# Patient Record
Sex: Female | Born: 2000
Health system: Southern US, Community
[De-identification: ages and names within clinical notes are randomized; demographics above are authoritative.]

## PROBLEM LIST (undated history)

## (undated) ENCOUNTER — Emergency Department: Admission: EM | Payer: Self-pay | Source: Home / Self Care

## (undated) DIAGNOSIS — Q249 Congenital malformation of heart, unspecified: Secondary | ICD-10-CM

## (undated) DIAGNOSIS — R519 Headache, unspecified: Secondary | ICD-10-CM

## (undated) DIAGNOSIS — R079 Chest pain, unspecified: Secondary | ICD-10-CM

## (undated) HISTORY — DX: Chest pain, unspecified: R07.9

## (undated) HISTORY — DX: Headache, unspecified: R51.9

## (undated) HISTORY — DX: Congenital malformation of heart, unspecified: Q24.9

## (undated) HISTORY — PX: WRIST FRACTURE SURGERY: SHX121

---
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
## 2019-06-10 ENCOUNTER — Other Ambulatory Visit: Payer: Self-pay

## 2019-06-10 DIAGNOSIS — Z20822 Contact with and (suspected) exposure to covid-19: Secondary | ICD-10-CM

## 2019-06-11 LAB — NOVEL CORONAVIRUS, NAA: SARS-CoV-2, NAA: NOT DETECTED

## 2019-11-26 ENCOUNTER — Ambulatory Visit: Payer: 59 | Attending: Internal Medicine

## 2019-11-26 DIAGNOSIS — Z20822 Contact with and (suspected) exposure to covid-19: Secondary | ICD-10-CM | POA: Insufficient documentation

## 2019-11-27 LAB — NOVEL CORONAVIRUS, NAA: SARS-CoV-2, NAA: NOT DETECTED

## 2019-11-27 LAB — SARS-COV-2, NAA 2 DAY TAT

## 2020-01-01 ENCOUNTER — Ambulatory Visit (HOSPITAL_COMMUNITY)
Admission: EM | Admit: 2020-01-01 | Discharge: 2020-01-01 | Disposition: A | Discharge status: Home / Self Care | Payer: 59 | Attending: Physician Assistant | Admitting: Physician Assistant

## 2020-01-01 ENCOUNTER — Encounter (HOSPITAL_COMMUNITY): Payer: Self-pay

## 2020-01-01 ENCOUNTER — Telehealth: Payer: Self-pay | Admitting: *Deleted

## 2020-01-01 ENCOUNTER — Other Ambulatory Visit: Payer: Self-pay

## 2020-01-01 DIAGNOSIS — J3089 Other allergic rhinitis: Secondary | ICD-10-CM | POA: Insufficient documentation

## 2020-01-01 DIAGNOSIS — J069 Acute upper respiratory infection, unspecified: Secondary | ICD-10-CM | POA: Insufficient documentation

## 2020-01-01 DIAGNOSIS — Z20822 Contact with and (suspected) exposure to covid-19: Secondary | ICD-10-CM | POA: Insufficient documentation

## 2020-01-01 MED ORDER — MUCINEX 600 MG PO TB12
600.0000 mg | ORAL_TABLET | Freq: Two times a day (BID) | ORAL | 0 refills | Status: AC
Start: 2020-01-01 — End: 2020-01-08

## 2020-01-01 MED ORDER — FLUTICASONE PROPIONATE 50 MCG/ACT NA SUSP: 1.0000 | Freq: Every day | NASAL | 1 refills | Status: DC

## 2020-01-01 MED ORDER — CETIRIZINE HCL 10 MG PO TABS: 10.0000 mg | ORAL_TABLET | Freq: Every day | ORAL | 0 refills | Status: DC

## 2020-01-01 MED ORDER — SALINE SPRAY 0.65 % NA SOLN: 1.0000 | NASAL | 0 refills | Status: DC | PRN

## 2020-01-01 MED ORDER — BENZONATATE 100 MG PO CAPS: 100.0000 mg | ORAL_CAPSULE | Freq: Three times a day (TID) | ORAL | 0 refills | Status: DC

## 2020-01-01 MED ORDER — CETIRIZINE HCL 10 MG PO TABS
10.0000 mg | ORAL_TABLET | Freq: Every day | ORAL | 0 refills | Status: DC
Start: 2020-01-01 — End: 2023-06-09

## 2020-01-01 MED ORDER — BENZONATATE 100 MG PO CAPS
100.0000 mg | ORAL_CAPSULE | Freq: Three times a day (TID) | ORAL | 0 refills | Status: DC
Start: 2020-01-01 — End: 2023-06-09

## 2020-01-01 MED ORDER — SALINE SPRAY 0.65 % NA SOLN
1.0000 | NASAL | 0 refills | Status: DC | PRN
Start: 2020-01-01 — End: 2023-06-09

## 2020-01-01 MED ORDER — MUCINEX 600 MG PO TB12
600.0000 mg | ORAL_TABLET | Freq: Two times a day (BID) | ORAL | 0 refills | Status: DC
Start: 2020-01-01 — End: 2020-01-01

## 2020-01-01 MED FILL — BENZONATATE 100 MG CAPS: 100 | 7 days supply | Qty: 21 | Fill #0

## 2020-01-01 NOTE — Discharge Instructions (Addendum)
Use the Tessalon for cough as needed Take Zyrtec daily Use the Flonase daily Take the Mucinex twice a day for 1 week, and sure you are drinking plenty of fluids and clearing your nasal secretions Use the nasal saline throughout the day  If you are worsening over the next 3 to 4 days or not had improvement in 1 week please return for reevaluation or schedule video visit or follow-up with primary care, if you do not have a primary care of supplied an option for you.  If your Covid-19 test is positive, you will receive a phone call from [facility] regarding your results. Negative test results are not called. Both positive and negative results area always visible on MyChart. If you do not have a MyChart account, sign up instructions are in your discharge papers.   Persons who are directed to care for themselves at home may discontinue isolation under the following conditions:   At least 10 days have passed since symptom onset and  At least 24 hours have passed without running a fever (this means without the use of fever-reducing medications) and  Other symptoms have improved.  Persons infected with COVID-19 who never develop symptoms may discontinue isolation and other precautions 10 days after the date of their first positive COVID-19 test.

## 2020-01-01 NOTE — ED Triage Notes (Signed)
Pt reports cough worse at night and nasal congestion x 1 week.

## 2020-01-01 NOTE — Telephone Encounter (Signed)
Pt called and requested her R xbe sent to a different pharmacy.  RNCM contacted prescribing EDP to assist.

## 2020-01-01 NOTE — ED Provider Notes (Signed)
[facility]    CSN: [id number] Arrival date & time: 01/01/20  4854      History   Chief Complaint Chief Complaint  Patient presents with  . Cough  . Nasal Congestion    HPI Erin Todd is a 19 y.o. female.   Patient presents for nasal congestion, runny nose and nighttime cough.  She reports symptoms started about 7 to 8 days ago with nasal congestion and runny nose.  She reports she developed a cough that is primarily worse at night.  She reports feeling drainage in her throat.  Denies seeing a sore throat.  Cough is occasionally slightly productive in the morning.  Denies shortness of breath or chest pain.  Denies headache or facial pain.  Denies ear pain.  Denies nausea, vomiting, diarrhea.  Denies change in taste or smell.     History reviewed. No pertinent past medical history.  There are no problems to display for this patient.   History reviewed. No pertinent surgical history.  OB History   No obstetric history on file.      Home Medications    Prior to Admission medications   Medication Sig Start Date End Date Taking? Authorizing Provider  ALYACEN 7/7/7 0.5/0.75/1-35 MG-MCG tablet Take 1 tablet by mouth daily. 12/20/19   [provider]  benzonatate (TESSALON) 100 MG capsule Take 1 capsule (100 mg total) by mouth every 8 (eight) hours. 01/01/20   Dillie Burandt, Veryl Speak, PA-C  cetirizine (ZYRTEC ALLERGY) 10 MG tablet Take 1 tablet (10 mg total) by mouth daily. 01/01/20   Afton Mikelson, Veryl Speak, PA-C  fluticasone (FLONASE) 50 MCG/ACT nasal spray Place 1 spray into both nostrils daily. 01/01/20   Tomi Paddock, Veryl Speak, PA-C  guaiFENesin (MUCINEX) 600 MG 12 hr tablet Take 1 tablet (600 mg total) by mouth 2 (two) times daily for 7 days. 01/01/20 01/08/20  Razan Siler, Veryl Speak, PA-C  sodium chloride (OCEAN) 0.65 % SOLN nasal spray Place 1 spray into both nostrils as needed for congestion. 01/01/20   Tiphani Mells, Veryl Speak, PA-C    Family History History reviewed. No pertinent family  history.  Social History Social History   Tobacco Use  . Smoking status: Never Smoker  . Smokeless tobacco: Never Used  Substance Use Topics  . Alcohol use: Never  . Drug use: Never     Allergies   Amoxicillin   Review of Systems Review of Systems   Physical Exam Triage Vital Signs ED Triage Vitals  Enc Vitals Group     BP 01/01/20 0841 (!) 143/85     Pulse Rate 01/01/20 0841 93     Resp 01/01/20 0841 18     Temp 01/01/20 0841 98.3 F (36.8 C)     Temp Source 01/01/20 0841 Oral     SpO2 01/01/20 0841 100 %     Weight --      Height --      Head Circumference --      Peak Flow --      Pain Score 01/01/20 0842 0     Pain Loc --      Pain Edu? --      Excl. in GC? --    No data found.  Updated Vital Signs BP (!) 143/85 (BP Location: Right Arm)   Pulse 93   Temp 98.3 F (36.8 C) (Oral)   Resp 18   LMP 12/30/2019 (Exact Date)   SpO2 100%   Visual Acuity Right Eye Distance:   Left Eye  Distance:   Bilateral Distance:    Right Eye Near:   Left Eye Near:    Bilateral Near:     Physical Exam Vitals and nursing note reviewed.  Constitutional:      General: She is not in acute distress.    Appearance: She is well-developed. She is not ill-appearing.  HENT:     Head: Normocephalic and atraumatic.     Nose: Congestion and rhinorrhea present.     Mouth/Throat:     Comments: No erythema or exudates.  Postnasal drip in the oropharynx with cobblestoning Eyes:     Conjunctiva/sclera: Conjunctivae normal.  Cardiovascular:     Rate and Rhythm: Normal rate and regular rhythm.     Heart sounds: No murmur.  Pulmonary:     Effort: Pulmonary effort is normal. No respiratory distress.     Breath sounds: Normal breath sounds. No wheezing, rhonchi or rales.  Abdominal:     Palpations: Abdomen is soft.     Tenderness: There is no abdominal tenderness.  Musculoskeletal:     Cervical back: Neck supple.  Skin:    General: Skin is warm and dry.  Neurological:      Mental Status: She is alert.      UC Treatments / Results  Labs (all labs ordered are listed, but only abnormal results are displayed) Labs Reviewed  SARS CORONAVIRUS 2 (TAT 6-24 HRS)    EKG   Radiology No results found.  Procedures Procedures (including critical care time)  Medications Ordered in UC Medications - No data to display  Initial Impression / Assessment and Plan / UC Course  I have reviewed the triage vital signs and the nursing notes.  Pertinent labs & imaging results that were available during my care of the patient were reviewed by me and considered in my medical decision making (see chart for details).    #Viral URI with cough #Allergic rhinitis Patient is 19 year old with viral URI with also likely allergic rhinitis.  Believe cough is related to postnasal drip.  She is afebrile with a clear lung exam doubt pneumonia or bronchitis.  Will treat symptomatically with Tessalon for cough, Zyrtec and Flonase for nasal signs and symptoms.  We will also recommend Mucinex.  Covid PCR sent.  Follow-up and return precautions if not improving over the next 3 to 4 days for consideration of a worsening sinusitis, at this time I do believe she has a virus causing most of her symptoms.  Patient verbalized understanding agreement this plan. Final Clinical Impressions(s) / UC Diagnoses   Final diagnoses:  Viral URI with cough  Allergic rhinitis due to other allergic trigger, unspecified seasonality     Discharge Instructions     Use the Tessalon for cough as needed Take Zyrtec daily Use the Flonase daily Take the Mucinex twice a day for 1 week, and sure you are drinking plenty of fluids and clearing your nasal secretions Use the nasal saline throughout the day  If you are worsening over the next 3 to 4 days or not had improvement in 1 week please return for reevaluation or schedule video visit or follow-up with primary care, if you do not have a primary care of supplied  an option for you.  If your Covid-19 test is positive, you will receive a phone call from [facility] regarding your results. Negative test results are not called. Both positive and negative results area always visible on MyChart. If you do not have a MyChart account, sign up  instructions are in your discharge papers.   Persons who are directed to care for themselves at home may discontinue isolation under the following conditions:  . At least 10 days have passed since symptom onset and . At least 24 hours have passed without running a fever (this means without the use of fever-reducing medications) and . Other symptoms have improved.  Persons infected with COVID-19 who never develop symptoms may discontinue isolation and other precautions 10 days after the date of their first positive COVID-19 test.      ED Prescriptions    Medication Sig Dispense Auth. Provider   cetirizine (ZYRTEC ALLERGY) 10 MG tablet  (Status: Discontinued) Take 1 tablet (10 mg total) by mouth daily. 30 tablet Chelsey Redondo, Veryl Speak, PA-C   fluticasone (FLONASE) 50 MCG/ACT nasal spray  (Status: Discontinued) Place 1 spray into both nostrils daily. 15.8 mL Camie Hauss, Veryl Speak, PA-C   benzonatate (TESSALON) 100 MG capsule  (Status: Discontinued) Take 1 capsule (100 mg total) by mouth every 8 (eight) hours. 21 capsule Denisia Harpole, Veryl Speak, PA-C   guaiFENesin (MUCINEX) 600 MG 12 hr tablet  (Status: Discontinued) Take 1 tablet (600 mg total) by mouth 2 (two) times daily for 7 days. 14 tablet Keion Neels, Veryl Speak, PA-C   sodium chloride (OCEAN) 0.65 % SOLN nasal spray  (Status: Discontinued) Place 1 spray into both nostrils as needed for congestion. 44 mL Alezandra Egli, Veryl Speak, PA-C   benzonatate (TESSALON) 100 MG capsule Take 1 capsule (100 mg total) by mouth every 8 (eight) hours. 21 capsule Giannie Soliday, Veryl Speak, PA-C   cetirizine (ZYRTEC ALLERGY) 10 MG tablet Take 1 tablet (10 mg total) by mouth daily. 30 tablet Dahl Higinbotham, Veryl Speak, PA-C   fluticasone (FLONASE) 50  MCG/ACT nasal spray Place 1 spray into both nostrils daily. 15.8 mL Refoel Palladino, Veryl Speak, PA-C   guaiFENesin (MUCINEX) 600 MG 12 hr tablet Take 1 tablet (600 mg total) by mouth 2 (two) times daily for 7 days. 14 tablet Santhiago Collingsworth, Veryl Speak, PA-C   sodium chloride (OCEAN) 0.65 % SOLN nasal spray Place 1 spray into both nostrils as needed for congestion. 44 mL Jatia Musa, Veryl Speak, PA-C     PDMP not reviewed this encounter.   Hermelinda Medicus, PA-C 01/01/20 2023

## 2020-01-02 LAB — SARS CORONAVIRUS 2 (TAT 6-24 HRS): SARS Coronavirus 2: NEGATIVE

## 2020-03-04 ENCOUNTER — Encounter (HOSPITAL_COMMUNITY): Payer: Self-pay

## 2020-03-04 ENCOUNTER — Emergency Department (HOSPITAL_COMMUNITY)
Admission: EM | Admit: 2020-03-04 | Discharge: 2020-03-04 | Disposition: A | Discharge status: Home / Self Care | Payer: 59 | Attending: Emergency Medicine | Admitting: Emergency Medicine

## 2020-03-04 ENCOUNTER — Other Ambulatory Visit: Payer: Self-pay

## 2020-03-04 DIAGNOSIS — U071 COVID-19: Secondary | ICD-10-CM | POA: Diagnosis not present

## 2020-03-04 DIAGNOSIS — R509 Fever, unspecified: Secondary | ICD-10-CM | POA: Diagnosis present

## 2020-03-04 LAB — SARS CORONAVIRUS 2 BY RT PCR (HOSPITAL ORDER, PERFORMED IN ~~LOC~~ HOSPITAL LAB): SARS Coronavirus 2: POSITIVE — AB

## 2020-03-04 MED ORDER — ACETAMINOPHEN 325 MG PO TABS
650.0000 mg | ORAL_TABLET | Freq: Once | ORAL | Status: AC | PRN
  Administered 2020-03-04: 650 mg via ORAL
  Filled 2020-03-04: qty 2

## 2020-03-04 MED ORDER — IBUPROFEN 200 MG PO TABS
400.0000 mg | ORAL_TABLET | Freq: Once | ORAL | Status: AC
  Administered 2020-03-04: 400 mg via ORAL
  Filled 2020-03-04: qty 2

## 2020-03-04 NOTE — Discharge Instructions (Addendum)
Alternate Tylenol and Motrin as needed for fevers.  Please self isolate for the next 2 weeks, until CDC guidelines listed below are met.  Return to the emergency room immediately for new or worsening symptoms or concerns, such as chest pain, shortness of breath, vomiting or any concerns at all.  CDC guidelines state you may return to work when all of the following criteria have been met: 1.  10 days since symptom onset 2.  You are fever free for 3 days without the use of Tylenol or Motrin 3.  Symptoms have improved, you are not feeling short of breath or coughing

## 2020-03-04 NOTE — ED Provider Notes (Signed)
[facility]EMERGENCY [facility]PT Provider Note   CSN: [id number] Arrival date & time: 03/04/20  1659     History Chief Complaint  Patient presents with  . Generalized Body Aches  . Fever  . Headache    Erin Todd is a 19 y.o. female.  HPI  19 year old female presents with myalgias.  Patient states that this morning she started feeling poorly with a backache.  She then noted that symptoms gradually worsened and she had generalized body aches.  She denies any chest pain, shortness of breath, nausea, vomiting, abdominal pain.  She denies any known sick contacts.  She is not vaccinated against COVID-19.     History reviewed. No pertinent past medical history.  There are no problems to display for this patient.   Past Surgical History:  Procedure Laterality Date  . WRIST FRACTURE SURGERY       OB History   No obstetric history on file.     Family History  Problem Relation Age of Onset  . Diabetes Mother   . Hypertension Mother   . Asthma Mother   . Hypertension Father     Social History   Tobacco Use  . Smoking status: Never Smoker  . Smokeless tobacco: Never Used  Vaping Use  . Vaping Use: Never used  Substance Use Topics  . Alcohol use: Never  . Drug use: Never    Home Medications Prior to Admission medications   Medication Sig Start Date End Date Taking? Authorizing Provider  ALYACEN 7/7/7 0.5/0.75/1-35 MG-MCG tablet Take 1 tablet by mouth daily. 12/20/19   [provider]  benzonatate (TESSALON) 100 MG capsule Take 1 capsule (100 mg total) by mouth every 8 (eight) hours. 01/01/20   Darr, Veryl Speak, PA-C  cetirizine (ZYRTEC ALLERGY) 10 MG tablet Take 1 tablet (10 mg total) by mouth daily. 01/01/20   Darr, Veryl Speak, PA-C  fluticasone (FLONASE) 50 MCG/ACT nasal spray Place 1 spray into both nostrils daily. 01/01/20   Darr, Veryl Speak, PA-C  sodium chloride (OCEAN) 0.65 % SOLN nasal spray Place 1 spray into both nostrils as needed for  congestion. 01/01/20   Darr, Veryl Speak, PA-C    Allergies    Amoxicillin  Review of Systems   Review of Systems  Constitutional: Negative for chills and fever.  Respiratory: Negative for shortness of breath.   Cardiovascular: Negative for chest pain.  Gastrointestinal: Negative for abdominal pain, nausea and vomiting.  Musculoskeletal: Positive for myalgias.  All other systems reviewed and are negative.   Physical Exam Updated Vital Signs BP 122/72   Pulse (!) 130   Temp (!) 102.8 F (39.3 C) (Oral)   Resp 20   Ht 5\' 6"  (1.676 m)   Wt 63.5 kg   LMP 02/26/2020   SpO2 99%   BMI 22.60 kg/m   Physical Exam Vitals and nursing note reviewed.  Constitutional:      Appearance: She is well-developed.  HENT:     Head: Normocephalic and atraumatic.  Eyes:     Conjunctiva/sclera: Conjunctivae normal.  Cardiovascular:     Rate and Rhythm: Normal rate and regular rhythm.     Heart sounds: Normal heart sounds. No murmur heard.   Pulmonary:     Effort: Pulmonary effort is normal. No respiratory distress.     Breath sounds: Normal breath sounds. No wheezing or rales.  Abdominal:     General: Bowel sounds are normal. There is no distension.     Palpations: Abdomen is soft.  Tenderness: There is no abdominal tenderness.  Musculoskeletal:        General: No tenderness or deformity. Normal range of motion.     Cervical back: Neck supple.  Skin:    General: Skin is warm and dry.     Findings: No erythema or rash.  Neurological:     Mental Status: She is alert and oriented to person, place, and time.  Psychiatric:        Behavior: Behavior normal.     ED Results / Procedures / Treatments   Labs (all labs ordered are listed, but only abnormal results are displayed) Labs Reviewed  SARS CORONAVIRUS 2 BY RT PCR (HOSPITAL ORDER, PERFORMED IN [facility])    EKG None  Radiology No results found.  Procedures Procedures (including critical care  time)  Medications Ordered in ED Medications  acetaminophen (TYLENOL) tablet 650 mg (650 mg Oral Given 03/04/20 1716)    ED Course  I have reviewed the triage vital signs and the nursing notes.  Pertinent labs & imaging results that were available during my care of the patient were reviewed by me and considered in my medical decision making (see chart for details).    MDM Rules/Calculators/A&P                          Patient presents with myalgias.  No other complaints.  Physical exam unremarkable.  Vital signs show febrile and tachycardia on arrival however this did improve with Tylenol Motrin in the ED.  Oxygen 96% or better.  She has no increased work of breathing or accessory muscle use.  Lungs are clear to clear to auscultation throughout.  She has a positive for COVID-19 the ED.  Discussed this with patient and need for self quarantine.  And strict return precautions.  Patient ready and stable for discharge.    Erin Todd was evaluated in Emergency Department on 03/04/2020 for the symptoms described in the history of present illness. She was evaluated in the context of the global COVID-19 pandemic, which necessitated consideration that the patient might be at risk for infection with the SARS-CoV-2 virus that causes COVID-19. Institutional protocols and algorithms that pertain to the evaluation of patients at risk for COVID-19 are in a state of rapid change based on information released by regulatory bodies including the CDC and federal and state organizations. These policies and algorithms were followed during the patient's care in the ED.    At this time there does not appear to be any evidence of an acute emergency medical condition and the patient appears stable for discharge with appropriate outpatient follow up.Diagnosis was discussed with patient who verbalizes understanding and is agreeable to discharge.   Final Clinical Impression(s) / ED Diagnoses Final diagnoses:  None     Rx / DC Orders ED Discharge Orders    None       Rueben Bash 03/04/20 2033    Geoffery Lyons, MD 03/04/20 2236

## 2020-06-05 ENCOUNTER — Ambulatory Visit: Payer: 59

## 2020-10-13 ENCOUNTER — Other Ambulatory Visit: Payer: 59

## 2022-01-14 ENCOUNTER — Encounter: Payer: Self-pay | Admitting: Emergency Medicine

## 2022-01-14 ENCOUNTER — Other Ambulatory Visit: Payer: Self-pay

## 2022-01-14 ENCOUNTER — Ambulatory Visit
Admission: EM | Admit: 2022-01-14 | Discharge: 2022-01-14 | Disposition: A | Discharge status: Home / Self Care | Payer: 59 | Attending: Emergency Medicine | Admitting: Emergency Medicine

## 2022-01-14 DIAGNOSIS — S61012A Laceration without foreign body of left thumb without damage to nail, initial encounter: Secondary | ICD-10-CM

## 2022-01-14 MED ORDER — SULFAMETHOXAZOLE-TRIMETHOPRIM 800-160 MG PO TABS: 1.0000 | ORAL_TABLET | Freq: Two times a day (BID) | ORAL | 0 refills | Status: AC

## 2022-01-14 NOTE — Discharge Instructions (Signed)
Please keep your wound covered for the next 24 hours.  After 24 hours if you feel like the wound is not draining you can certainly leave it open.  Please keep the Steri-Strip in place for the next 10 days.  After 10 days of is not fallen off on its own, please feel free to remove it.  To protect you from developing an infection in your thumb, please begin Bactrim 1 tablet twice daily for the next 5 days.  If you notice any redness, swelling, increased pain in your thumb despite taking antibiotics, please return for repeat evaluation.  Thank you for visiting urgent care today.

## 2022-01-14 NOTE — ED Provider Notes (Signed)
[facility]    CSN: [id number] Arrival date & time: 01/14/22  1532    HISTORY   Chief Complaint  Patient presents with   Extremity Laceration   HPI Erin Todd is a 21 y.o. female. Patient presents to urgent care today complaining of cutting herself with a kitchen knife, has a laceration on the lateral aspect of her left thumb, patient has the wound bandaged on arrival and states bleeding is well controlled at this time.  Patient denies loss of sensation, loss of range of motion of her thumb.    History reviewed. No pertinent past medical history. There are no problems to display for this patient.  Past Surgical History:  Procedure Laterality Date   WRIST FRACTURE SURGERY     OB History   No obstetric history on file.    Home Medications    Prior to Admission medications   Medication Sig Start Date End Date Taking? Authorizing Provider  ALYACEN 7/7/7 0.5/0.75/1-35 MG-MCG tablet Take 1 tablet by mouth daily. 12/20/19   [provider]  benzonatate (TESSALON) 100 MG capsule Take 1 capsule (100 mg total) by mouth every 8 (eight) hours. 01/01/20   Darr, Gerilyn Pilgrim, PA-C  cetirizine (ZYRTEC ALLERGY) 10 MG tablet Take 1 tablet (10 mg total) by mouth daily. 01/01/20   Darr, Gerilyn Pilgrim, PA-C  fluticasone (FLONASE) 50 MCG/ACT nasal spray Place 1 spray into both nostrils daily. 01/01/20   Darr, Gerilyn Pilgrim, PA-C  sodium chloride (OCEAN) 0.65 % SOLN nasal spray Place 1 spray into both nostrils as needed for congestion. 01/01/20   Darr, Gerilyn Pilgrim, PA-C    Family History Family History  Problem Relation Age of Onset   Diabetes Mother    Hypertension Mother    Asthma Mother    Hypertension Father    Social History Social History   Tobacco Use   Smoking status: Never   Smokeless tobacco: Never  Vaping Use   Vaping Use: Never used  Substance Use Topics   Alcohol use: Never   Drug use: Never   Allergies   Amoxicillin  Review of Systems Review of Systems Pertinent  findings noted in history of present illness.   Physical Exam Triage Vital Signs ED Triage Vitals  Enc Vitals Group     BP 06/03/21 0827 (!) 147/82     Pulse Rate 06/03/21 0827 72     Resp 06/03/21 0827 18     Temp 06/03/21 0827 98.3 F (36.8 C)     Temp Source 06/03/21 0827 Oral     SpO2 06/03/21 0827 98 %     Weight --      Height --      Head Circumference --      Peak Flow --      Pain Score 06/03/21 0826 5     Pain Loc --      Pain Edu? --      Excl. in GC? --   No data found.  Updated Vital Signs BP 136/82 (BP Location: Left Arm)   Pulse (!) 102   Temp 99.6 F (37.6 C) (Oral)   Resp 18   SpO2 98%   Physical Exam Vitals and nursing note reviewed.  Constitutional:      General: She is not in acute distress.    Appearance: Normal appearance. She is not ill-appearing.  HENT:     Head: Normocephalic and atraumatic.  Eyes:     General: Lids are normal.        Right  eye: No discharge.        Left eye: No discharge.     Extraocular Movements: Extraocular movements intact.     Conjunctiva/sclera: Conjunctivae normal.     Right eye: Right conjunctiva is not injected.     Left eye: Left conjunctiva is not injected.  Neck:     Trachea: Trachea and phonation normal.  Cardiovascular:     Rate and Rhythm: Normal rate and regular rhythm.     Pulses: Normal pulses.     Heart sounds: Normal heart sounds. No murmur heard.    No friction rub. No gallop.  Pulmonary:     Effort: Pulmonary effort is normal. No accessory muscle usage, prolonged expiration or respiratory distress.     Breath sounds: Normal breath sounds. No stridor, decreased air movement or transmitted upper airway sounds. No decreased breath sounds, wheezing, rhonchi or rales.  Chest:     Chest wall: No tenderness.  Musculoskeletal:        General: Normal range of motion.     Cervical back: Normal range of motion and neck supple. Normal range of motion.  Lymphadenopathy:     Cervical: No cervical  adenopathy.  Skin:    General: Skin is warm and dry.     Findings: Lesion (Lateral left thumb) present. No erythema or rash.  Neurological:     General: No focal deficit present.     Mental Status: She is alert and oriented to person, place, and time.  Psychiatric:        Mood and Affect: Mood normal.        Behavior: Behavior normal.     Visual Acuity Right Eye Distance:   Left Eye Distance:   Bilateral Distance:    Right Eye Near:   Left Eye Near:    Bilateral Near:     UC Couse / Diagnostics / Procedures:    EKG  Radiology No results found.  Procedures Laceration Repair  Date/Time: 01/14/2022 3:52 PM  Performed by: Theadora RamaMorgan, Kiernan Farkas Scales, PA-C Authorized by: Theadora RamaMorgan, Kierre Deines Scales, PA-C   Consent:    Consent obtained:  Verbal   Consent given by:  Patient   Risks, benefits, and alternatives were discussed: yes     Risks discussed:  Infection, need for additional repair, poor cosmetic result, pain and poor wound healing   Alternatives discussed:  No treatment, delayed treatment, observation and referral Universal protocol:    Procedure explained and questions answered to patient or proxy's satisfaction: yes     Patient identity confirmed:  Verbally with patient and arm band Anesthesia:    Anesthesia method:  None Laceration details:    Location:  Finger   Finger location:  L thumb   Length (cm):  1   Depth (mm):  1 Exploration:    Limited defect created (wound extended): no   Treatment:    Area cleansed with:  Povidone-iodine   Debridement:  None   Undermining:  None   Scar revision: no   Skin repair:    Repair method:  Steri-Strips   Number of Steri-Strips:  1 Approximation:    Approximation:  Close Repair type:    Repair type:  Simple Post-procedure details:    Dressing:  Non-adherent dressing   Procedure completion:  Tolerated  (including critical care time)  UC Diagnoses / Final Clinical Impressions(s)   I have reviewed the triage vital  signs and the nursing notes.  Pertinent labs & imaging results that were available during my care  of the patient were reviewed by me and considered in my medical decision making (see chart for details).    Final diagnoses:  Laceration of left thumb without foreign body without damage to nail, initial encounter   Given minimal depth of laceration, recommend antibiotic prophylaxis with Bactrim to cover for staph due to location on thumb and Steri-Strip applied to approximate the wound for improved healing.  ED Prescriptions     Medication Sig Dispense Auth. Provider   sulfamethoxazole-trimethoprim (BACTRIM DS) 800-160 MG tablet Take 1 tablet by mouth 2 (two) times daily for 5 days. 10 tablet Theadora Rama Scales, PA-C      PDMP not reviewed this encounter.  Pending results:  Labs Reviewed - No data to display  Medications Ordered in UC: Medications - No data to display  Disposition Upon Discharge:  Condition: stable for discharge home Home: take medications as prescribed; routine discharge instructions as discussed; follow up as advised.  Patient presented with an acute illness with associated systemic symptoms and significant discomfort requiring urgent management. In my opinion, this is a condition that a prudent lay person (someone who possesses an average knowledge of health and medicine) may potentially expect to result in complications if not addressed urgently such as respiratory distress, impairment of bodily function or dysfunction of bodily organs.   Routine symptom specific, illness specific and/or disease specific instructions were discussed with the patient and/or caregiver at length.   As such, the patient has been evaluated and assessed, work-up was performed and treatment was provided in alignment with urgent care protocols and evidence based medicine.  Patient/parent/caregiver has been advised that the patient may require follow up for further testing and treatment if  the symptoms continue in spite of treatment, as clinically indicated and appropriate.  Patient/parent/caregiver has been advised to return to the [facility] or PCP if no better; to PCP or the Emergency Department if new signs and symptoms develop, or if the current signs or symptoms continue to change or worsen for further workup, evaluation and treatment as clinically indicated and appropriate  The patient will follow up with their current PCP if and as advised. If the patient does not currently have a PCP we will assist them in obtaining one.   The patient may need specialty follow up if the symptoms continue, in spite of conservative treatment and management, for further workup, evaluation, consultation and treatment as clinically indicated and appropriate.   Patient/parent/caregiver verbalized understanding and agreement of plan as discussed.  All questions were addressed during visit.  Please see discharge instructions below for further details of plan.  Discharge Instructions:   Discharge Instructions      Please keep your wound covered for the next 24 hours.  After 24 hours if you feel like the wound is not draining you can certainly leave it open.  Please keep the Steri-Strip in place for the next 10 days.  After 10 days of is not fallen off on its own, please feel free to remove it.  To protect you from developing an infection in your thumb, please begin Bactrim 1 tablet twice daily for the next 5 days.  If you notice any redness, swelling, increased pain in your thumb despite taking antibiotics, please return for repeat evaluation.  Thank you for visiting urgent care today.      This office note has been dictated using Teaching laboratory technician.  Unfortunately, and despite my best efforts, this method of dictation can sometimes lead to occasional typographical  or grammatical errors.  I apologize in advance if this occurs.     Theadora Rama Scales, [place] 01/15/22 [phone]

## 2022-01-14 NOTE — ED Triage Notes (Signed)
Pt here for laceration to left thumb with knife today; bleeding controlled at present

## 2022-04-06 ENCOUNTER — Encounter: Payer: Self-pay | Admitting: *Deleted

## 2022-04-07 ENCOUNTER — Ambulatory Visit: Payer: 59 | Admitting: Neurology

## 2022-05-01 ENCOUNTER — Ambulatory Visit: Admission: EM | Admit: 2022-05-01 | Discharge: 2022-05-01 | Discharge status: Against Medical Advice | Payer: 59

## 2022-10-16 ENCOUNTER — Other Ambulatory Visit: Payer: 59

## 2022-11-22 ENCOUNTER — Emergency Department (HOSPITAL_BASED_OUTPATIENT_CLINIC_OR_DEPARTMENT_OTHER): Payer: 59

## 2022-11-22 ENCOUNTER — Emergency Department (HOSPITAL_BASED_OUTPATIENT_CLINIC_OR_DEPARTMENT_OTHER)
Admission: EM | Admit: 2022-11-22 | Discharge: 2022-11-22 | Disposition: A | Discharge status: Home / Self Care | Payer: 59 | Attending: Emergency Medicine | Admitting: Emergency Medicine

## 2022-11-22 ENCOUNTER — Encounter (HOSPITAL_BASED_OUTPATIENT_CLINIC_OR_DEPARTMENT_OTHER): Payer: Self-pay

## 2022-11-22 ENCOUNTER — Other Ambulatory Visit: Payer: Self-pay

## 2022-11-22 DIAGNOSIS — S5012XA Contusion of left forearm, initial encounter: Secondary | ICD-10-CM | POA: Diagnosis not present

## 2022-11-22 DIAGNOSIS — Z23 Encounter for immunization: Secondary | ICD-10-CM | POA: Diagnosis not present

## 2022-11-22 DIAGNOSIS — S51811A Laceration without foreign body of right forearm, initial encounter: Secondary | ICD-10-CM | POA: Diagnosis not present

## 2022-11-22 DIAGNOSIS — S59911A Unspecified injury of right forearm, initial encounter: Secondary | ICD-10-CM | POA: Diagnosis present

## 2022-11-22 DIAGNOSIS — S41111A Laceration without foreign body of right upper arm, initial encounter: Secondary | ICD-10-CM

## 2022-11-22 DIAGNOSIS — W540XXA Bitten by dog, initial encounter: Secondary | ICD-10-CM | POA: Insufficient documentation

## 2022-11-22 MED ORDER — DOXYCYCLINE HYCLATE 100 MG PO CAPS: 100.0000 mg | ORAL_CAPSULE | Freq: Two times a day (BID) | ORAL | 0 refills | Status: DC

## 2022-11-22 MED ORDER — METRONIDAZOLE 500 MG PO TABS: 500.0000 mg | ORAL_TABLET | Freq: Two times a day (BID) | ORAL | 0 refills | Status: DC

## 2022-11-22 MED ORDER — METRONIDAZOLE 500 MG PO TABS
500.0000 mg | ORAL_TABLET | Freq: Once | ORAL | Status: AC
  Administered 2022-11-22: 500 mg via ORAL
  Filled 2022-11-22: qty 1

## 2022-11-22 MED ORDER — TETANUS-DIPHTH-ACELL PERTUSSIS 5-2.5-18.5 LF-MCG/0.5 IM SUSY
0.5000 mL | PREFILLED_SYRINGE | Freq: Once | INTRAMUSCULAR | Status: AC
  Administered 2022-11-22: 0.5 mL via INTRAMUSCULAR
  Filled 2022-11-22: qty 0.5

## 2022-11-22 MED ORDER — DOXYCYCLINE HYCLATE 100 MG PO TABS
100.0000 mg | ORAL_TABLET | Freq: Once | ORAL | Status: AC
  Administered 2022-11-22: 100 mg via ORAL
  Filled 2022-11-22: qty 1

## 2022-11-22 MED ORDER — IBUPROFEN 800 MG PO TABS
ORAL_TABLET | ORAL | Status: AC
  Filled 2022-11-22: qty 1

## 2022-11-22 MED ORDER — LIDOCAINE HCL 1 % IJ SOLN
10.0000 mL | Freq: Once | INTRAMUSCULAR | Status: AC
  Administered 2022-11-22: 10 mL
  Filled 2022-11-22: qty 20

## 2022-11-22 MED ORDER — HYDROCODONE-ACETAMINOPHEN 5-325 MG PO TABS
1.0000 | ORAL_TABLET | Freq: Once | ORAL | Status: DC
  Filled 2022-11-22: qty 1

## 2022-11-22 MED ORDER — IBUPROFEN 800 MG PO TABS
800.0000 mg | ORAL_TABLET | Freq: Once | ORAL | Status: DC
  Filled 2022-11-22: qty 1

## 2022-11-22 NOTE — ED Provider Notes (Signed)
EMERGENCY DEPARTMENT AT [facility] Provider Note   CSN: [id number] Arrival date & time: 11/22/22  1844     History  Chief Complaint  Patient presents with   Animal Bite    Erin Todd is a 22 y.o. female, who presents to the ED 2/2 to being bitten by her dog an hour ago.  Notes her dog got spooked by another dog barking, and bit her a lot.  States her dog is UTD on immunizations, no concern for rabies. Unknown last tetanus. Reports bilateral arm pain.     Home Medications Prior to Admission medications   Medication Sig Start Date End Date Taking? Authorizing Provider  doxycycline (VIBRAMYCIN) 100 MG capsule Take 1 capsule (100 mg total) by mouth 2 (two) times daily. 11/22/22  Yes Leelynn Whetsel L, PA  metroNIDAZOLE (FLAGYL) 500 MG tablet Take 1 tablet (500 mg total) by mouth 2 (two) times daily. 11/22/22  Yes Ebrahim Deremer Elbert Ewings, PA  ALYACEN 7/7/7 0.5/0.75/1-35 MG-MCG tablet Take 1 tablet by mouth daily. 12/20/19   [provider]  amitriptyline (ELAVIL) 50 MG tablet Take 50 mg by mouth at bedtime. 02/27/22   [provider]  benzonatate (TESSALON) 100 MG capsule Take 1 capsule (100 mg total) by mouth every 8 (eight) hours. 01/01/20   Darr, Gerilyn Pilgrim, PA-C  cetirizine (ZYRTEC ALLERGY) 10 MG tablet Take 1 tablet (10 mg total) by mouth daily. 01/01/20   Darr, Gerilyn Pilgrim, PA-C  fluticasone (FLONASE) 50 MCG/ACT nasal spray Place 1 spray into both nostrils daily. 01/01/20   Darr, Gerilyn Pilgrim, PA-C  sodium chloride (OCEAN) 0.65 % SOLN nasal spray Place 1 spray into both nostrils as needed for congestion. 01/01/20   Darr, Gerilyn Pilgrim, PA-C  Ubrogepant (UBRELVY) 100 MG TABS Take 100 mg by mouth as needed (migraine). May repeat in 2 hours if needed. Max dose of 2/day for migraine rescue    [provider]      Allergies    Amoxicillin    Review of Systems   Review of Systems  Constitutional:  Negative for fever.  Skin:  Positive for wound.    Physical Exam Updated  Vital Signs BP (!) 148/93 (BP Location: Right Arm)   Pulse (!) 107   Temp 98.5 F (36.9 C) (Oral)   Resp 18   Ht 5\' 5"  (1.651 m)   Wt 87.1 kg   SpO2 100%   BMI 31.95 kg/m  Physical Exam Vitals and nursing note reviewed.  Constitutional:      General: She is not in acute distress.    Appearance: She is well-developed.  HENT:     Head: Normocephalic and atraumatic.  Eyes:     Conjunctiva/sclera: Conjunctivae normal.  Cardiovascular:     Rate and Rhythm: Normal rate and regular rhythm.     Heart sounds: No murmur heard. Pulmonary:     Effort: Pulmonary effort is normal. No respiratory distress.     Breath sounds: Normal breath sounds.  Abdominal:     Palpations: Abdomen is soft.     Tenderness: There is no abdominal tenderness.  Musculoskeletal:        General: Swelling present.     Cervical back: Neck supple.     Comments: Bruising and swelling to bilateral lower forearms.  No wrist tenderness to palpation or hand tenderness to palpation.  Skin:    General: Skin is warm and dry.     Capillary Refill: Capillary refill takes less than 2 seconds.     Comments:  Multiple abrasions to bilateral arms.  Specifically a 2 cm laceration to the posterior aspect of her forearm, 1 cm laceration to anterolateral forearm, and another 1 cm laceration to anterolateral forearm  Neurological:     Mental Status: She is alert.  Psychiatric:        Mood and Affect: Mood normal.     ED Results / Procedures / Treatments   Labs (all labs ordered are listed, but only abnormal results are displayed) Labs Reviewed - No data to display   EKG None  Radiology DG Forearm Right  Result Date: 11/22/2022 CLINICAL DATA:  Dog bites to bilateral forearms. Prior surgery 2 bilateral risk for fractures. EXAM: LEFT FOREARM - 2 VIEW; RIGHT FOREARM - 2 VIEW COMPARISON:  Left wrist radiographs 05/04/2011 and report from right hand radiographs 12/02/2021 FINDINGS: RIGHT: Soft tissue injury/laceration about  the distal right forearm. No underlying fracture or dislocation. LEFT: Soft tissue swelling about the volar aspect of the distal forearm. No underlying fracture or dislocation. IMPRESSION: Soft tissue injuries compatible with history of dog bite to both forearms. No underlying osseous abnormality. Electronically Signed   By: Minerva Fester M.D.   On: 11/22/2022 20:54   DG Forearm Left  Result Date: 11/22/2022 CLINICAL DATA:  Dog bites to bilateral forearms. Prior surgery 2 bilateral risk for fractures. EXAM: LEFT FOREARM - 2 VIEW; RIGHT FOREARM - 2 VIEW COMPARISON:  Left wrist radiographs 05/04/2011 and report from right hand radiographs 12/02/2021 FINDINGS: RIGHT: Soft tissue injury/laceration about the distal right forearm. No underlying fracture or dislocation. LEFT: Soft tissue swelling about the volar aspect of the distal forearm. No underlying fracture or dislocation. IMPRESSION: Soft tissue injuries compatible with history of dog bite to both forearms. No underlying osseous abnormality. Electronically Signed   By: Minerva Fester M.D.   On: 11/22/2022 20:54    Procedures .Marland KitchenLaceration Repair  Date/Time: 11/22/2022 9:06 PM  Performed by: Pete Pelt, PA Authorized by: Pete Pelt, PA   Consent:    Consent obtained:  Verbal   Consent given by:  Patient   Risks, benefits, and alternatives were discussed: yes     Risks discussed:  Infection, pain, poor cosmetic result, need for additional repair, nerve damage and poor wound healing   Alternatives discussed:  Delayed treatment Universal protocol:    Patient identity confirmed:  Verbally with patient Anesthesia:    Anesthesia method:  Local infiltration   Local anesthetic:  Lidocaine 1% w/o epi and lidocaine 1% WITH epi Laceration details:    Location:  Shoulder/arm   Shoulder/arm location:  R lower arm   Length (cm):  2 Pre-procedure details:    Preparation:  Patient was prepped and draped in usual sterile fashion Treatment:     Area cleansed with:  Chlorhexidine   Amount of cleaning:  Standard   Irrigation solution:  Sterile saline Skin repair:    Repair method:  Sutures   Suture size:  4-0   Suture material:  Prolene   Number of sutures:  3 Approximation:    Approximation:  Loose Repair type:    Repair type:  Simple Post-procedure details:    Dressing:  Non-adherent dressing   Procedure completion:  Tolerated .Marland KitchenLaceration Repair  Date/Time: 11/22/2022 9:07 PM  Performed by: Pete Pelt, PA Authorized by: Pete Pelt, PA   Consent:    Consent obtained:  Verbal   Consent given by:  Patient   Risks, benefits, and alternatives were discussed: yes  Risks discussed:  Infection, poor cosmetic result, nerve damage, need for additional repair, poor wound healing, tendon damage, vascular damage, retained foreign body and pain   Alternatives discussed:  No treatment Universal protocol:    Patient identity confirmed:  Verbally with patient Anesthesia:    Anesthesia method:  Local infiltration   Local anesthetic:  Lidocaine 1% WITH epi Laceration details:    Location:  Shoulder/arm   Shoulder/arm location:  R lower arm   Length (cm):  1 Treatment:    Area cleansed with:  Chlorhexidine   Amount of cleaning:  Standard   Irrigation solution:  Tap water and sterile saline Skin repair:    Repair method:  Sutures   Suture size:  4-0   Suture material:  Prolene   Number of sutures:  2 Approximation:    Approximation:  Loose Repair type:    Repair type:  Simple Post-procedure details:    Dressing:  Non-adherent dressing .Marland KitchenLaceration Repair  Date/Time: 11/22/2022 9:08 PM  Performed by: Pete Pelt, PA Authorized by: Pete Pelt, PA   Consent:    Consent obtained:  Verbal   Consent given by:  Patient   Risks, benefits, and alternatives were discussed: yes     Risks discussed:  Infection, pain, poor cosmetic result, need for additional repair, nerve damage, poor wound healing,  vascular damage, tendon damage and retained foreign body   Alternatives discussed:  No treatment Anesthesia:    Anesthesia method:  Local infiltration   Local anesthetic:  Lidocaine 1% w/o epi Laceration details:    Location:  Shoulder/arm   Shoulder/arm location:  R lower arm   Length (cm):  1 Treatment:    Area cleansed with:  Chlorhexidine   Amount of cleaning:  Standard   Irrigation solution:  Sterile saline Skin repair:    Repair method:  Sutures   Suture size:  4-0   Suture material:  Prolene   Number of sutures:  2 Approximation:    Approximation:  Loose Repair type:    Repair type:  Simple Post-procedure details:    Dressing:  Non-adherent dressing     Medications Ordered in ED Medications  ibuprofen (ADVIL) tablet 800 mg (800 mg Oral Not Given 11/22/22 2109)  Tdap (BOOSTRIX) injection 0.5 mL (0.5 mLs Intramuscular Given 11/22/22 2050)  lidocaine (XYLOCAINE) 1 % (with pres) injection 10 mL (10 mLs Other Given by Other 11/22/22 2038)  ibuprofen (ADVIL) 800 MG tablet (  Given 11/22/22 2049)  doxycycline (VIBRA-TABS) tablet 100 mg (100 mg Oral Given 11/22/22 2125)  metroNIDAZOLE (FLAGYL) tablet 500 mg (500 mg Oral Given 11/22/22 2125)    ED Course/ Medical Decision Making/ A&P                             Medical Decision Making Patient is a 22 year old female, here for dog bite,/multiple dog bites to the bilateral arms.  We will start her on Flagyl and doxycycline as she is allergic to Augmentin.  Last menstrual period was 3 weeks ago.  She had only states she is not pregnant.  Discussed risk of toxicity with fetus, pregnant, and she voiced understanding stating that she is not pregnant and last menstrual period was 3 weeks ago.  Repaired arms, x-rays obtained to rule out any kind of avulsion fractures.  Tetanus updated secondary to wounds.  Amount and/or Complexity of Data Reviewed Labs: ordered. Radiology: ordered.    Details: X-rays unremarkable no  fractures Discussion of management or test interpretation with external provider(s): See above, treated as dog bite, with Flagyl, doxycycline for wound/cellulitic control, loosely closed some of the larger wound on the right arm, see procedure above.  Not completely closed to prevent any kind of contained infection.  Additionally given strict return precautions put on antibiotics, wounds wrapped in nonadherent gauze.  No evidence of avulsion fractures.  Dog is up-to-date on immunizations thus rabies injections, not given.  Risk Prescription drug management.    Final Clinical Impression(s) / ED Diagnoses Final diagnoses:  Dog bite of upper extremity, unspecified laterality, initial encounter  Laceration of multiple sites of right upper extremity, initial encounter    Rx / DC Orders ED Discharge Orders          Ordered    doxycycline (VIBRAMYCIN) 100 MG capsule  2 times daily        11/22/22 2112    metroNIDAZOLE (FLAGYL) 500 MG tablet  2 times daily        11/22/22 2112              Appolonia Ackert, [place], [place] 11/22/22 2344    Jacalyn Lefevre, MD 11/23/22 1505

## 2022-11-22 NOTE — ED Triage Notes (Signed)
Patient arrives to ED POV after her dog attacked her. Pt has multiple wounds to bilateral Forearms and wrist. Bleeding is controlled. Pt does not remember her last Tetanus shot and states her dog has vaccines up to date.

## 2022-11-22 NOTE — Discharge Instructions (Addendum)
Please follow-up with your primary care doctor, for your stitches to be removed in 7 to 14 days.  They are loosely tied, and is to help prevent infection, return to the ER if you have redness, swelling, fever worsening pain.

## 2023-06-09 ENCOUNTER — Telehealth: Payer: 59 | Admitting: Family

## 2023-06-09 DIAGNOSIS — R399 Unspecified symptoms and signs involving the genitourinary system: Secondary | ICD-10-CM | POA: Diagnosis not present

## 2023-06-09 MED ORDER — SULFAMETHOXAZOLE-TRIMETHOPRIM 800-160 MG PO TABS
1.0000 | ORAL_TABLET | Freq: Two times a day (BID) | ORAL | 0 refills | Status: AC
Start: 2023-06-09 — End: ?

## 2023-06-09 NOTE — Progress Notes (Signed)
Virtual Visit Consent   Erin Todd, you are scheduled for a virtual visit with a [facility] provider today. Just as with appointments in the office, your consent must be obtained to participate. Your consent will be active for this visit and any virtual visit you may have with one of our providers in the next 365 days. If you have a MyChart account, a copy of this consent can be sent to you electronically.  As this is a virtual visit, video technology does not allow for your provider to perform a traditional examination. This may limit your provider's ability to fully assess your condition. If your provider identifies any concerns that need to be evaluated in person or the need to arrange testing (such as labs, EKG, etc.), we will make arrangements to do so. Although advances in technology are sophisticated, we cannot ensure that it will always work on either your end or our end. If the connection with a video visit is poor, the visit may have to be switched to a telephone visit. With either a video or telephone visit, we are not always able to ensure that we have a secure connection.  By engaging in this virtual visit, you consent to the provision of healthcare and authorize for your insurance to be billed (if applicable) for the services provided during this visit. Depending on your insurance coverage, you may receive a charge related to this service.  I need to obtain your verbal consent now. Are you willing to proceed with your visit today? Erin Todd has provided verbal consent on 06/09/2023 for a virtual visit (video or telephone). Jannifer Rodney, FNP  Date: 06/09/2023 6:05 PM  Virtual Visit via Video Note   I, Jannifer Rodney, connected with  Erin Todd  ([id number], 2000-12-10) on 06/09/23 at  6:00 PM EDT by a video-enabled telemedicine application and verified that I am speaking with the correct person using two identifiers.  Location: Patient: Virtual Visit Location Patient:  Home Provider: Virtual Visit Location Provider: Home Office   I discussed the limitations of evaluation and management by telemedicine and the availability of in person appointments. The patient expressed understanding and agreed to proceed.    History of Present Illness: Erin Todd is a 22 y.o. who identifies as a female who was assigned female at birth, and is being seen today for UTI symptoms that started two days.   HPI: Dysuria  This is a new problem. The current episode started in the past 7 days. The problem occurs intermittently. The problem has been waxing and waning. The quality of the pain is described as burning. The pain is at a severity of 3/10. The pain is mild. Associated symptoms include frequency, hematuria, hesitancy and urgency. Pertinent negatives include no nausea or vomiting. She has tried increased fluids for the symptoms. The treatment provided mild relief.    Problems: There are no problems to display for this patient.   Allergies:  Allergies  Allergen Reactions   Amoxicillin Hives   Medications:  Current Outpatient Medications:    sulfamethoxazole-trimethoprim (BACTRIM DS) 800-160 MG tablet, Take 1 tablet by mouth 2 (two) times daily., Disp: 14 tablet, Rfl: 0   ALYACEN 7/7/7 0.5/0.75/1-35 MG-MCG tablet, Take 1 tablet by mouth daily., Disp: , Rfl:    amitriptyline (ELAVIL) 50 MG tablet, Take 50 mg by mouth at bedtime., Disp: , Rfl:    Ubrogepant (UBRELVY) 100 MG TABS, Take 100 mg by mouth as needed (migraine). May repeat in 2 hours if needed. Max  dose of 2/day for migraine rescue, Disp: , Rfl:   Observations/Objective: Patient is well-developed, well-nourished in no acute distress.  Resting comfortably  at home.  Head is normocephalic, atraumatic.  No labored breathing.  Speech is clear and coherent with logical content.  Patient is alert and oriented at baseline.    Assessment and Plan: 1. UTI symptoms - sulfamethoxazole-trimethoprim (BACTRIM DS)  800-160 MG tablet; Take 1 tablet by mouth 2 (two) times daily.  Dispense: 14 tablet; Refill: 0  Force fluids AZO over the counter X2 days Follow up if symptoms worsen or do not improve   Follow Up Instructions: I discussed the assessment and treatment plan with the patient. The patient was provided an opportunity to ask questions and all were answered. The patient agreed with the plan and demonstrated an understanding of the instructions.  A copy of instructions were sent to the patient via MyChart unless otherwise noted below.    The patient was advised to call back or seek an in-person evaluation if the symptoms worsen or if the condition fails to improve as anticipated.    Jannifer Rodney, FNP

## 2023-06-19 NOTE — Addendum Note (Signed)
Addended by: Jannifer Rodney A on: 06/19/2023 03:19 PM   Modules accepted: Level of Service

## 2023-06-29 ENCOUNTER — Other Ambulatory Visit: Payer: Self-pay | Admitting: Family

## 2023-06-29 DIAGNOSIS — R399 Unspecified symptoms and signs involving the genitourinary system: Secondary | ICD-10-CM

## 2023-07-05 ENCOUNTER — Telehealth: Payer: 59 | Admitting: Family Medicine

## 2023-07-05 DIAGNOSIS — N760 Acute vaginitis: Secondary | ICD-10-CM

## 2023-07-05 MED ORDER — FLUCONAZOLE 150 MG PO TABS
150.0000 mg | ORAL_TABLET | Freq: Once | ORAL | 0 refills | Status: AC
Start: 2023-07-05 — End: 2023-07-05

## 2023-07-05 NOTE — Progress Notes (Signed)
